# Patient Record
Sex: Female | Born: 1960 | Race: White | Hispanic: No
Health system: Southern US, Community
[De-identification: ages and names within clinical notes are randomized; demographics above are authoritative.]

## PROBLEM LIST (undated history)

## (undated) DIAGNOSIS — K5792 Diverticulitis of intestine, part unspecified, without perforation or abscess without bleeding: Secondary | ICD-10-CM

## (undated) HISTORY — DX: Diverticulitis of intestine, part unspecified, without perforation or abscess without bleeding: K57.92

---

## 2001-09-14 ENCOUNTER — Encounter: Admission: RE | Admit: 2001-09-14 | Discharge: 2001-09-14 | Payer: Self-pay | Admitting: Emergency Medicine

## 2001-09-14 ENCOUNTER — Encounter: Payer: Self-pay | Admitting: Emergency Medicine

## 2001-09-20 ENCOUNTER — Encounter: Payer: Self-pay | Admitting: Emergency Medicine

## 2001-09-20 ENCOUNTER — Encounter: Admission: RE | Admit: 2001-09-20 | Discharge: 2001-09-20 | Payer: Self-pay | Admitting: Emergency Medicine

## 2001-10-03 ENCOUNTER — Other Ambulatory Visit: Admission: RE | Admit: 2001-10-03 | Discharge: 2001-10-03 | Payer: Self-pay | Admitting: Obstetrics and Gynecology

## 2001-10-10 ENCOUNTER — Encounter: Payer: Self-pay | Admitting: Obstetrics and Gynecology

## 2001-10-10 ENCOUNTER — Ambulatory Visit (HOSPITAL_COMMUNITY): Admission: RE | Admit: 2001-10-10 | Discharge: 2001-10-10 | Payer: Self-pay | Admitting: Obstetrics and Gynecology

## 2001-10-16 ENCOUNTER — Encounter: Admission: RE | Admit: 2001-10-16 | Discharge: 2001-10-16 | Payer: Self-pay | Admitting: Emergency Medicine

## 2001-10-16 ENCOUNTER — Encounter: Payer: Self-pay | Admitting: Emergency Medicine

## 2001-11-17 ENCOUNTER — Encounter: Admission: RE | Admit: 2001-11-17 | Discharge: 2001-11-17 | Payer: Self-pay | Admitting: Emergency Medicine

## 2001-11-17 ENCOUNTER — Encounter: Payer: Self-pay | Admitting: Emergency Medicine

## 2001-12-28 ENCOUNTER — Ambulatory Visit (HOSPITAL_COMMUNITY): Admission: RE | Admit: 2001-12-28 | Discharge: 2001-12-28 | Payer: Self-pay | Admitting: Emergency Medicine

## 2002-01-09 ENCOUNTER — Ambulatory Visit (HOSPITAL_COMMUNITY): Admission: RE | Admit: 2002-01-09 | Discharge: 2002-01-09 | Payer: Self-pay | Admitting: Obstetrics and Gynecology

## 2002-01-25 ENCOUNTER — Encounter: Admission: RE | Admit: 2002-01-25 | Discharge: 2002-01-25 | Payer: Self-pay | Admitting: Emergency Medicine

## 2002-01-25 ENCOUNTER — Encounter: Payer: Self-pay | Admitting: Emergency Medicine

## 2002-01-26 ENCOUNTER — Ambulatory Visit (HOSPITAL_COMMUNITY): Admission: RE | Admit: 2002-01-26 | Discharge: 2002-01-26 | Payer: Self-pay | Admitting: Obstetrics and Gynecology

## 2002-08-02 ENCOUNTER — Ambulatory Visit (HOSPITAL_COMMUNITY): Admission: RE | Admit: 2002-08-02 | Discharge: 2002-08-02 | Payer: Self-pay | Admitting: Emergency Medicine

## 2002-08-02 ENCOUNTER — Encounter: Admission: RE | Admit: 2002-08-02 | Discharge: 2002-08-02 | Payer: Self-pay | Admitting: Emergency Medicine

## 2002-08-02 ENCOUNTER — Encounter: Payer: Self-pay | Admitting: Emergency Medicine

## 2002-08-06 ENCOUNTER — Ambulatory Visit (HOSPITAL_COMMUNITY): Admission: RE | Admit: 2002-08-06 | Discharge: 2002-08-06 | Payer: Self-pay | Admitting: Emergency Medicine

## 2002-08-06 ENCOUNTER — Encounter: Payer: Self-pay | Admitting: Emergency Medicine

## 2002-09-17 ENCOUNTER — Ambulatory Visit (HOSPITAL_COMMUNITY): Admission: RE | Admit: 2002-09-17 | Discharge: 2002-09-17 | Payer: Self-pay | Admitting: Gastroenterology

## 2003-03-27 ENCOUNTER — Encounter: Admission: RE | Admit: 2003-03-27 | Discharge: 2003-03-27 | Payer: Self-pay | Admitting: Emergency Medicine

## 2003-04-25 ENCOUNTER — Encounter: Admission: RE | Admit: 2003-04-25 | Discharge: 2003-04-25 | Payer: Self-pay | Admitting: Emergency Medicine

## 2004-05-08 IMAGING — CT CT ABDOMEN W/O CM
1 series · 15 of 32 positions shown, 19 images · IV contrast (BAROCAT)
Comparison: none

FINDINGS
CLINICAL DATA: LEFT LOWER QUADRANT ABDOMINAL PELVIC PAIN.  PREVIOUS RIGHT OVARIAN CYST.  POST
LAPAROSCOPIC SURGERY.  FEVER.  WEIGHT LOSS.  MICROHEMATURIA.  DIARRHEA.  THYROID WORKUP MITIGATES
AGAINST IODINE CONTRAST ADMINISTRATION.
ABDOMINAL CT WITHOUT CONTRAST:

[Series 2: routine abdomen · axial · 0.68mm/px · z∈[-355,-15]mm · 15 of 76 slices shown, 19 images]
[im 5/76  soft-tissue]
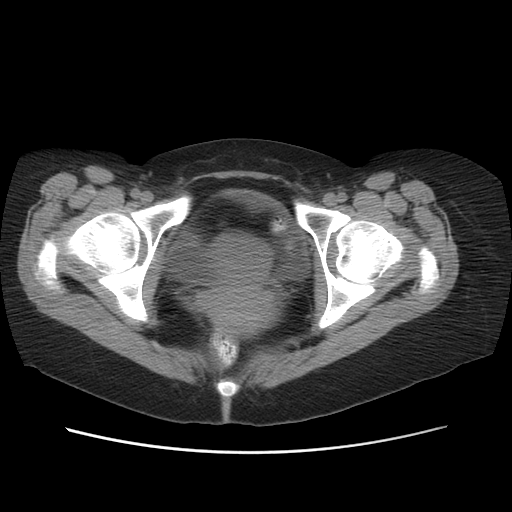
[im 5/76  bone]
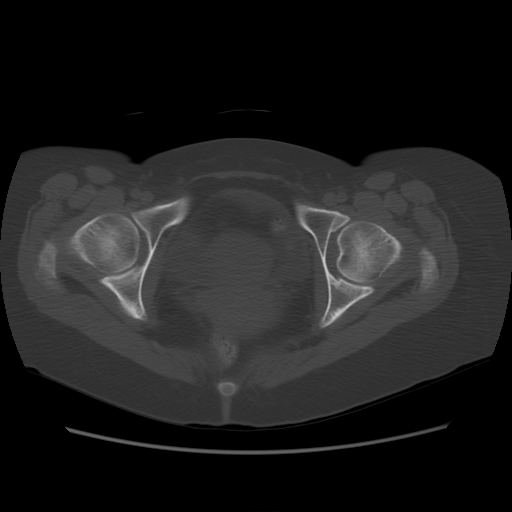
[im 10/76  soft-tissue]
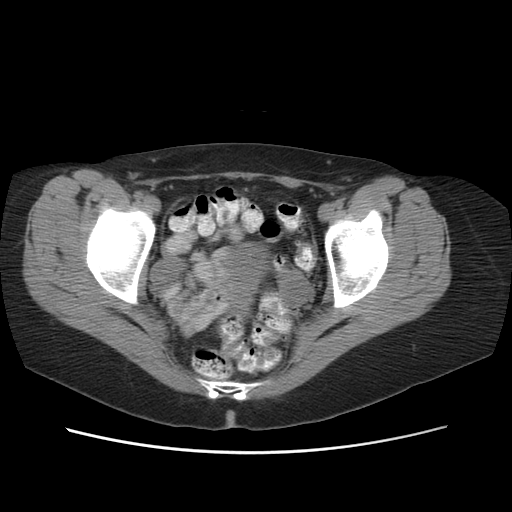
[im 15/76  soft-tissue]
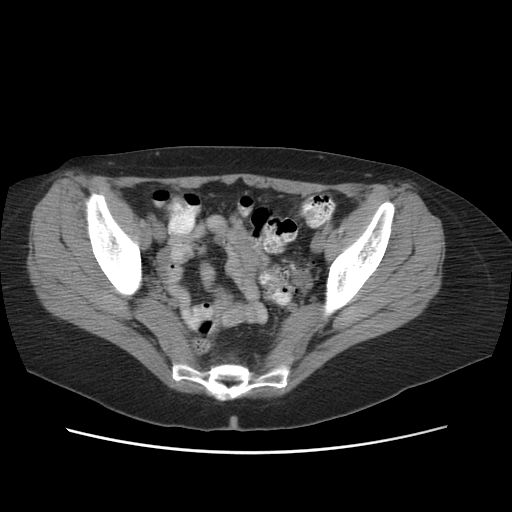
[im 22/76  soft-tissue]
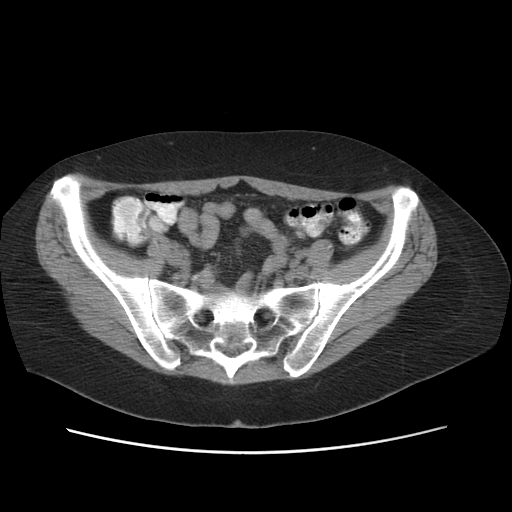
[im 27/76  soft-tissue]
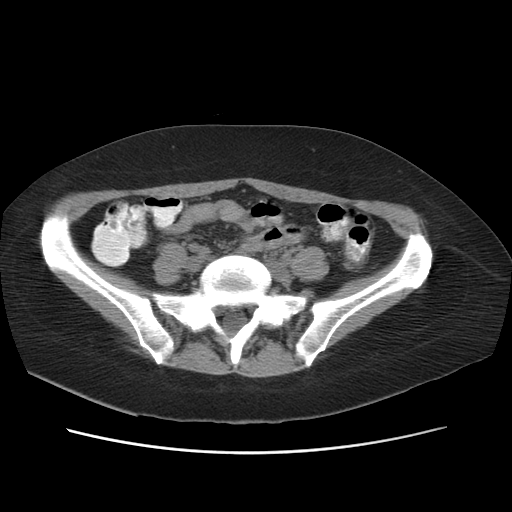
[im 32/76  soft-tissue]
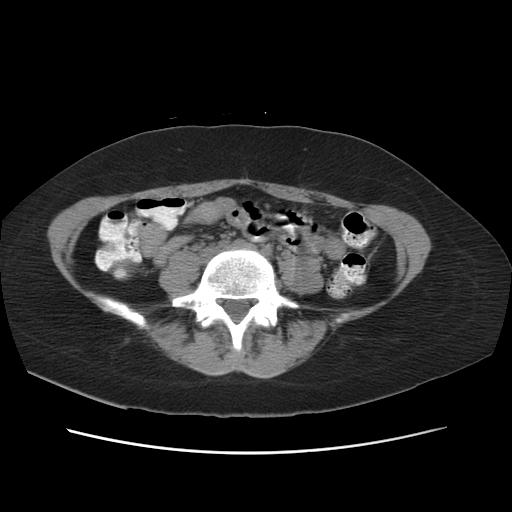
[im 39/76  soft-tissue]
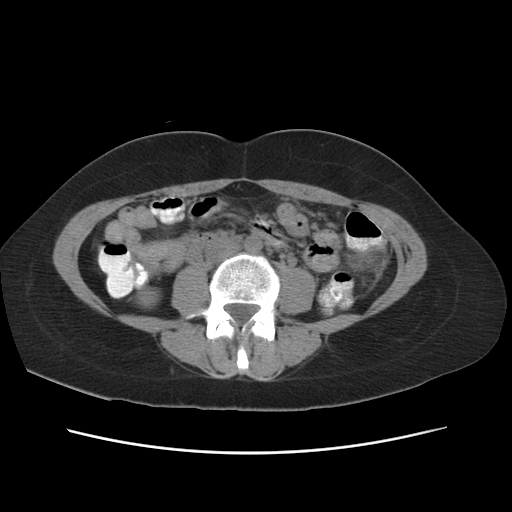
[im 44/76  soft-tissue]
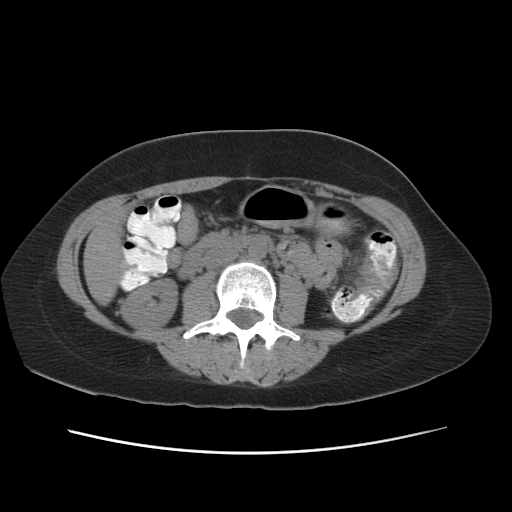
[im 49/76  soft-tissue]
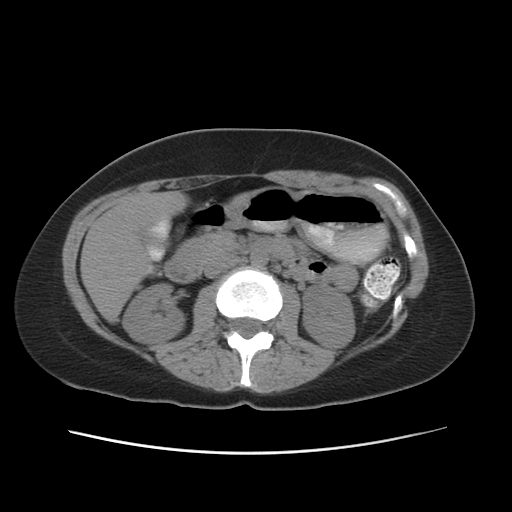
[im 49/76  bone]
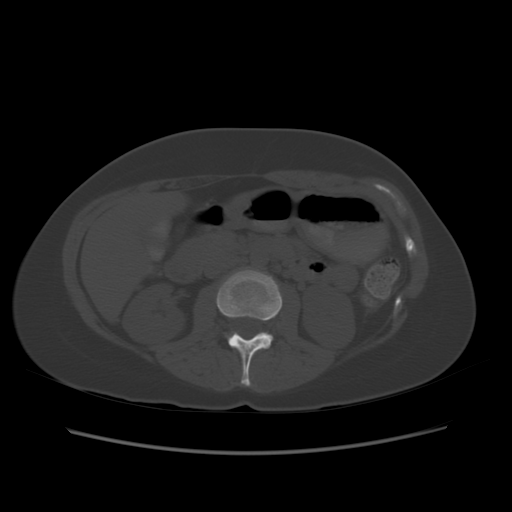
[im 54/76  soft-tissue]
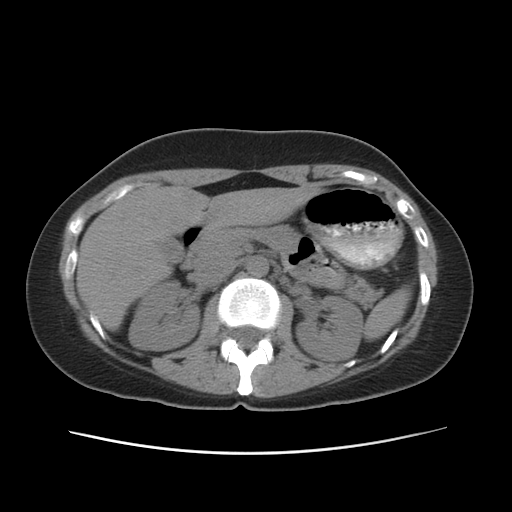
[im 61/76  soft-tissue]
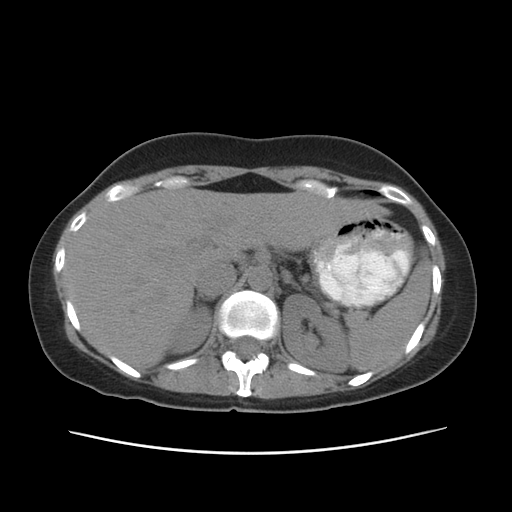
[im 66/76  soft-tissue]
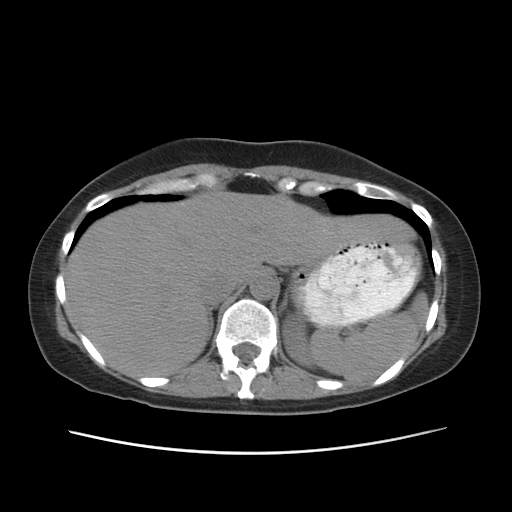
[im 66/76  lung]
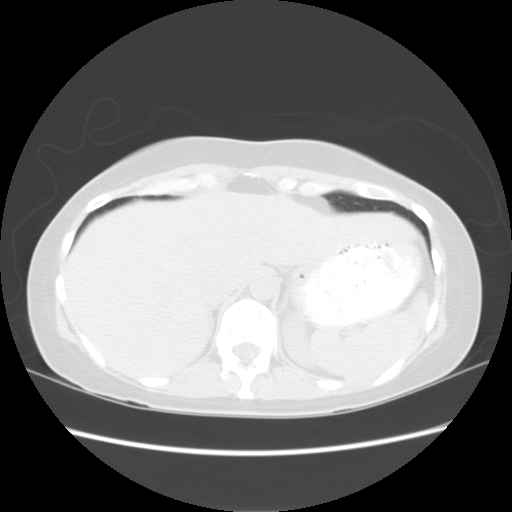
[im 68/76  lung]
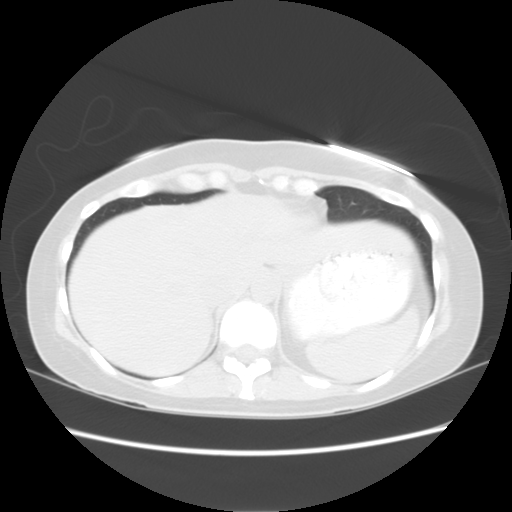
[im 71/76  soft-tissue]
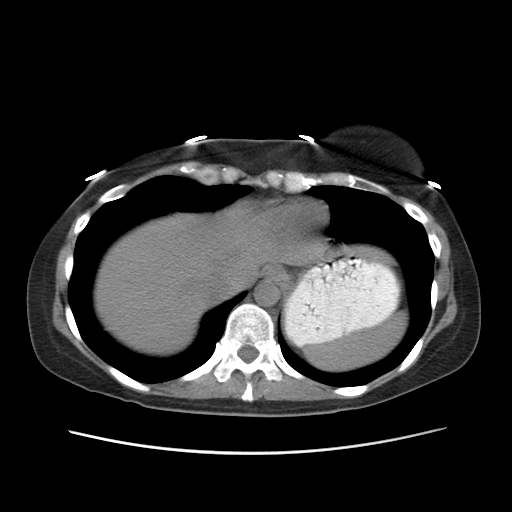
[im 71/76  lung]
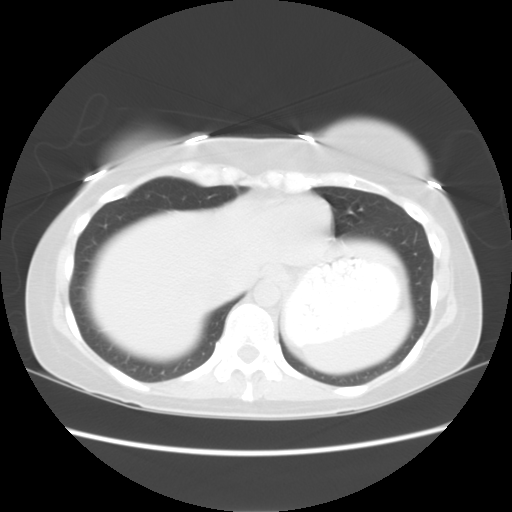
[im 73/76  lung]
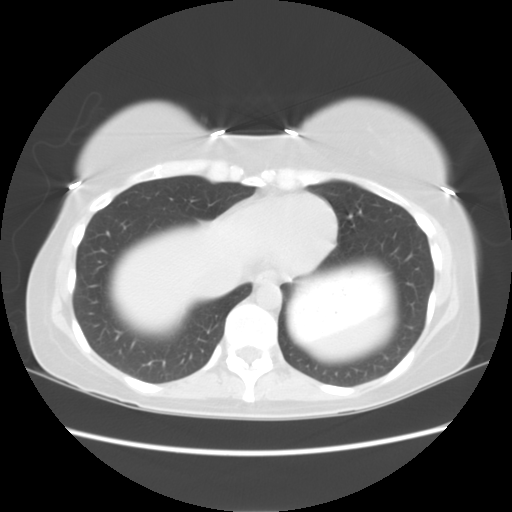

[15 of 32 positions shown; findings below may reference images not displayed]

FINDINGS: COMPARISON IS MADE WITH PRIOR ENHANCED STUDY OF 09/20/01.  FOLLOWING BARCAT ORAL CONTRAST ONLY
MULTIDETECTOR SPIRAL AXIAL IMAGES THROUGH THE ABDOMEN DEMONSTRATES INTERVAL APPROXIMATE 4 CM AREA
OF ABNORMAL MURAL THICKENING AT THE MID DESCENDING COLON WITH ADJACENT MESENTERIC AND MINIMAL
PERICOLIC GUTTER EDEMA.  THIS FAVORS FOCAL COLITIS ALTHOUGH NO DIVERTICULOSIS IS SEEN BY CT
ASSESSMENT.  PRIMARY COLON CARCINOMA IS POSSIBLE YET FELT LESS LIKELY.  SINCE PRIOR STUDY TWO 4 MM
HEPATIC CYSTS ARE STABLE (IMAGES 12 AND 16).  NO INTERVAL RENAL CALCULI OR HYDRONEPHROSIS ARE SEEN.
 REMAINING ABDOMINAL ORGANS ARE STABLE WITH NO OTHER NEW INFLAMMATION, FREE AIR, ABSCESS, NOR
ADENOPATHY SEEN.
IMPRESSION: SINCE 09/20/01:
[DATE].  PROBABLE FOCAL DESCENDING COLITIS WITH PRIMARY COLON CARCINOMA FELT LESS LIKELY.
2.  TWO TINY RIGHT HEPATIC CYSTS STABLE.
3.  OTHERWISE NEGATIVE.
PELVIC CT WITHOUT CONTRAST:
NON-CONTRAST SPIRAL CT OF THE PELVIS WAS PERFORMED.
THERE IS NO EVIDENCE OF URINARY TRACT CALCULI OR URETERECTASIS.  THE OTHER PELVIC STRUCTURES ARE
UNREMARKABLE. THERE IS NO EVIDENCE OF MASSES, INFLAMMATORY PROCESS, OR ABNORMAL FLUID COLLECTIONS.
SINCE 09/20/01, PREVIOUS RIGHT OVARIAN CYST IS NO LONGER PRESENT.
IMPRESSION
NORMAL.

## 2004-12-31 IMAGING — CT CT PELVIS W/ CM
1 series · 15 of 32 positions shown, 19 images · IV contrast (G+ & 100 ML OMNI 300)
Comparison: none

CLINICAL DATA: Lower abdominal pain.  Con ? V14.8,
ABDOMEN AND PELVIS CT WITH CONTRAST
Comparison 08/02/02.
TECHNIQUE: Contiguous axial images of the abdomen and pelvis were obtained after oral and 100 cc of nonionic intravenous contrast.

[Series 2: routine abdomen · axial · 0.66mm/px · z∈[-375,-50]mm · 15 of 102 slices shown, 19 images]
[im 7/102  soft-tissue]
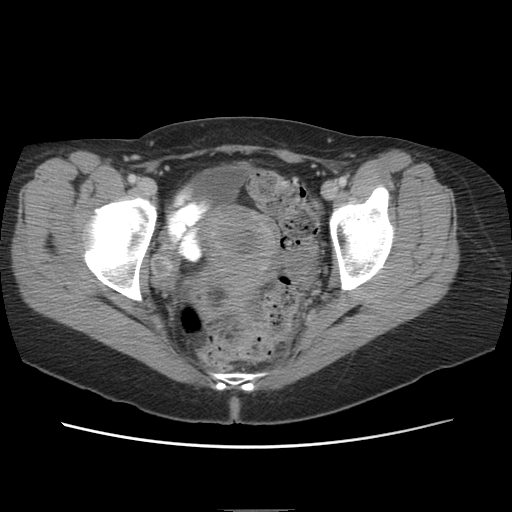
[im 7/102  bone]
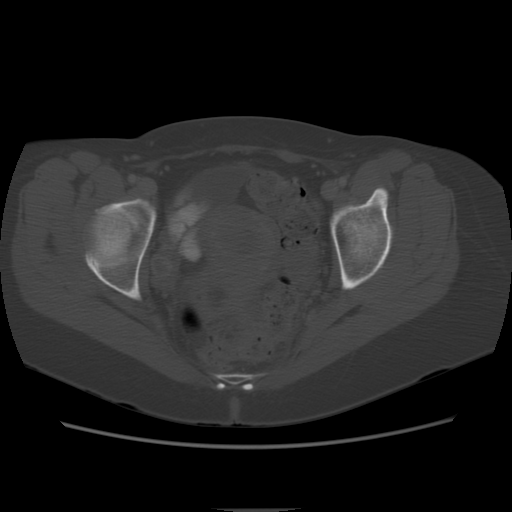
[im 14/102  soft-tissue]
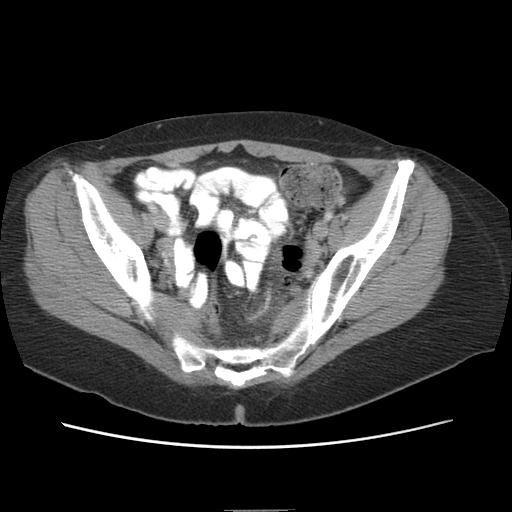
[im 20/102  soft-tissue]
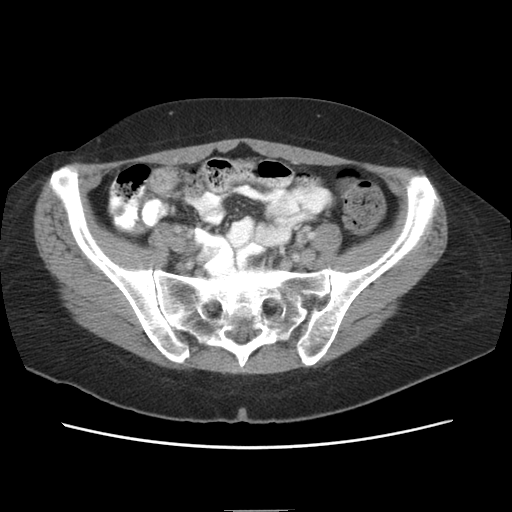
[im 30/102  soft-tissue]
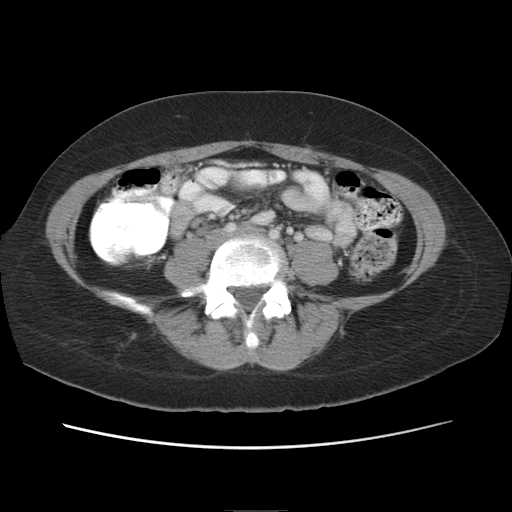
[im 36/102  soft-tissue]
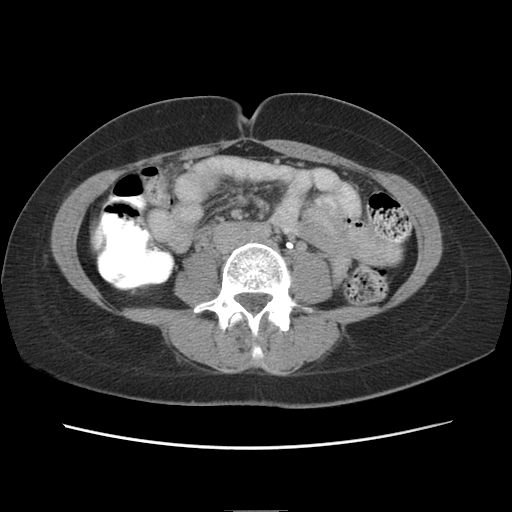
[im 43/102  soft-tissue]
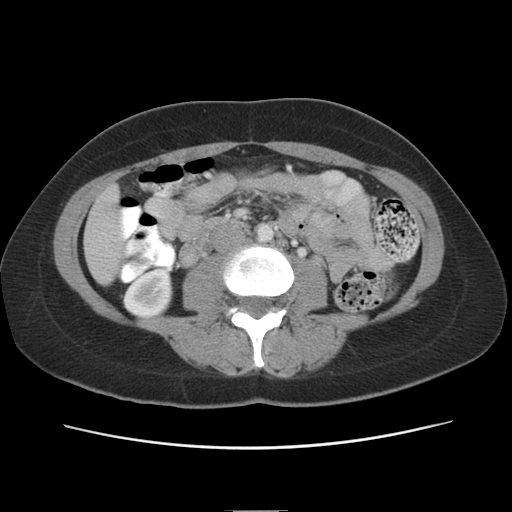
[im 53/102  soft-tissue]
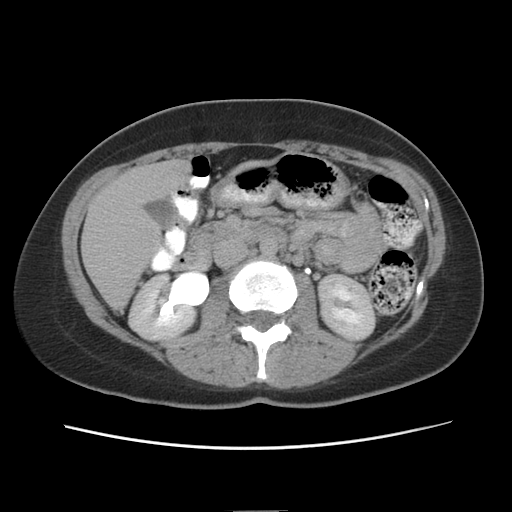
[im 59/102  soft-tissue]
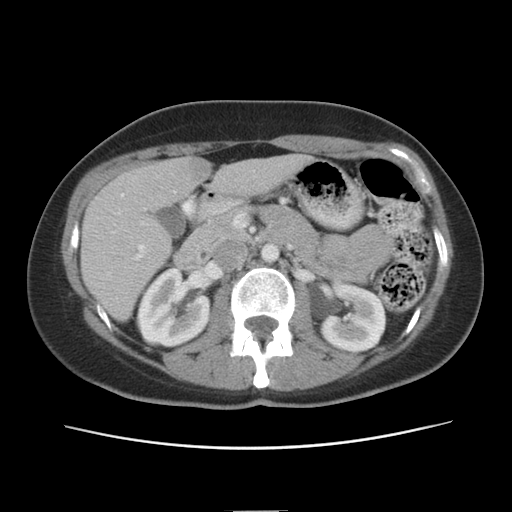
[im 66/102  soft-tissue]
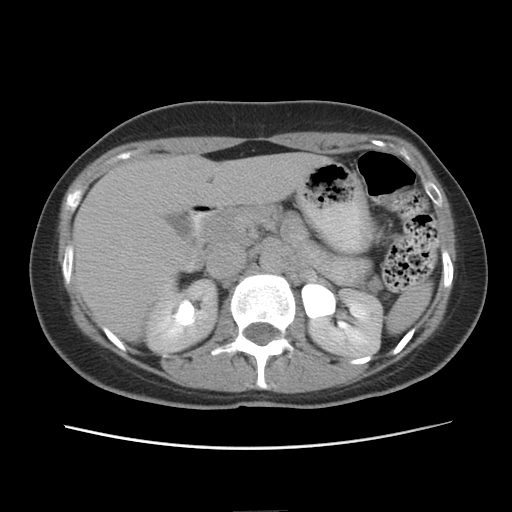
[im 66/102  bone]
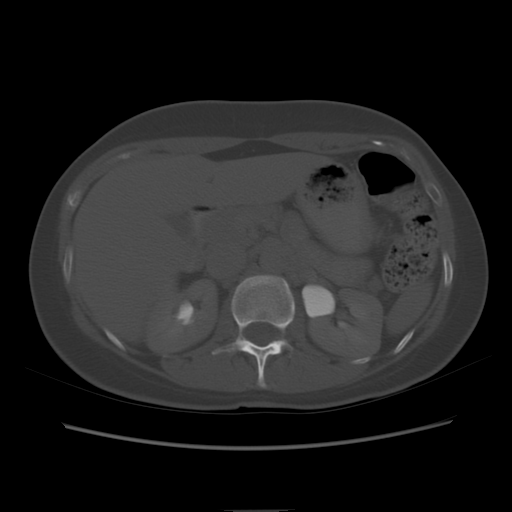
[im 72/102  soft-tissue]
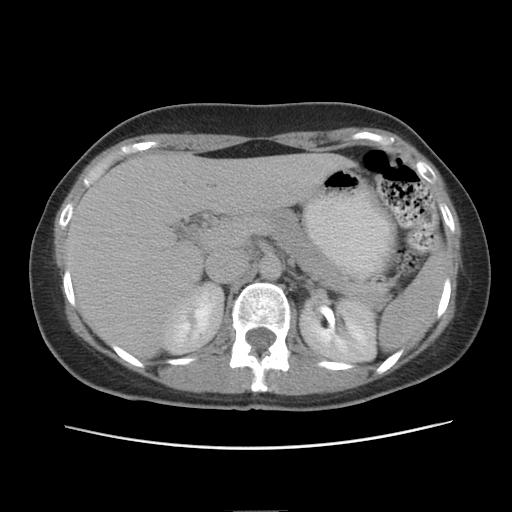
[im 82/102  soft-tissue]
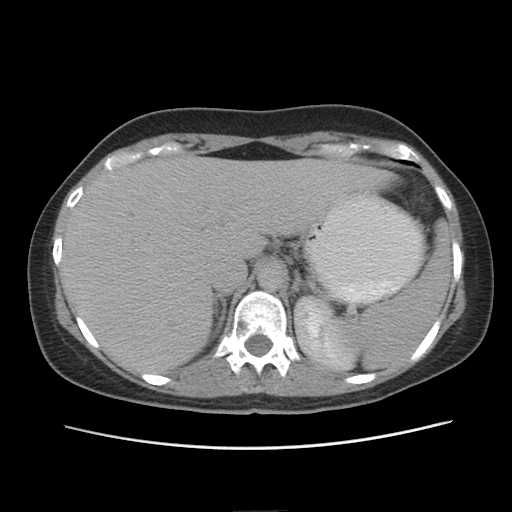
[im 88/102  soft-tissue]
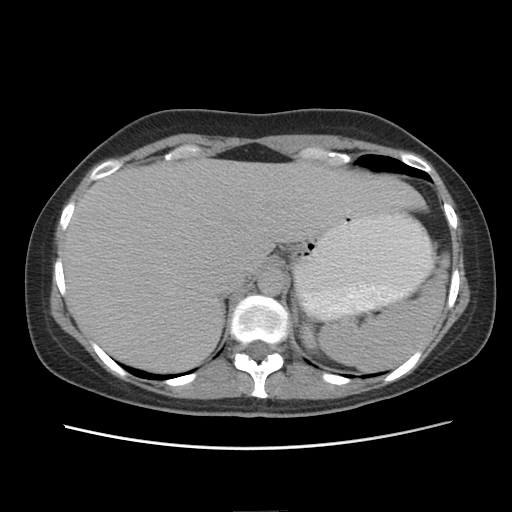
[im 88/102  lung]
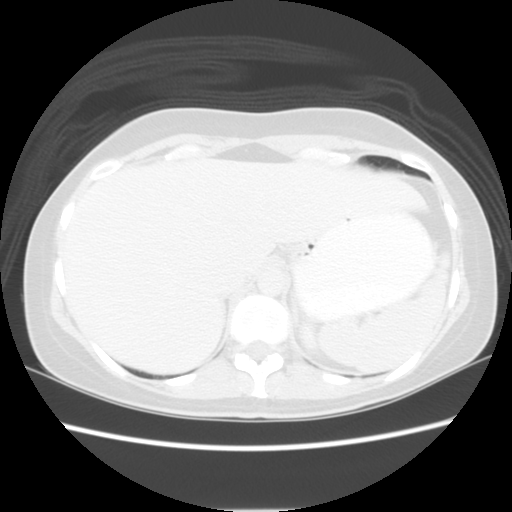
[im 92/102  lung]
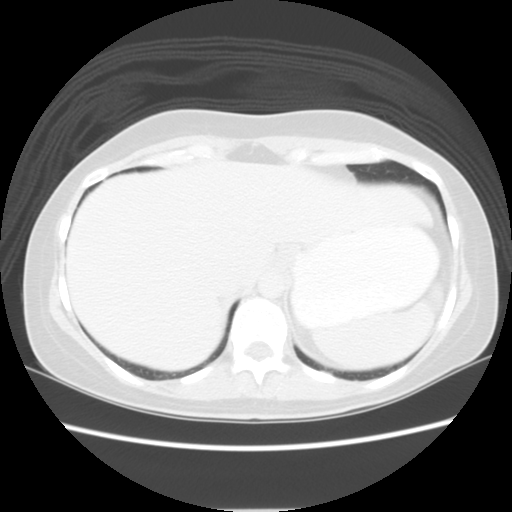
[im 95/102  soft-tissue]
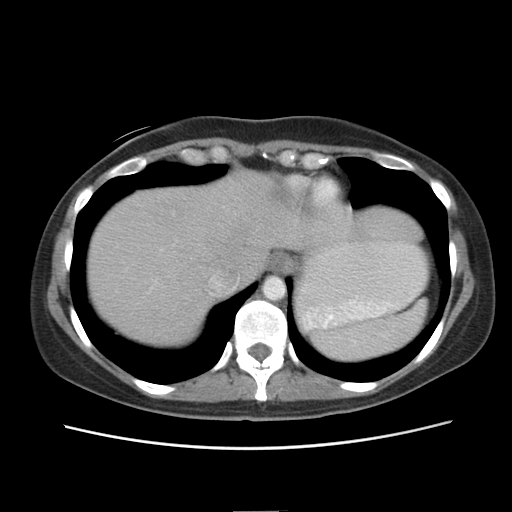
[im 95/102  lung]
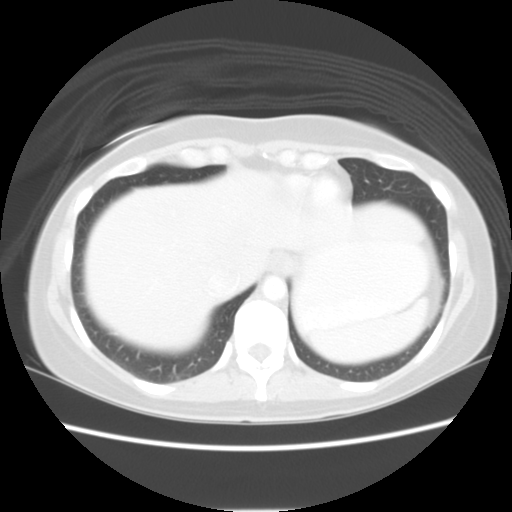
[im 98/102  lung]
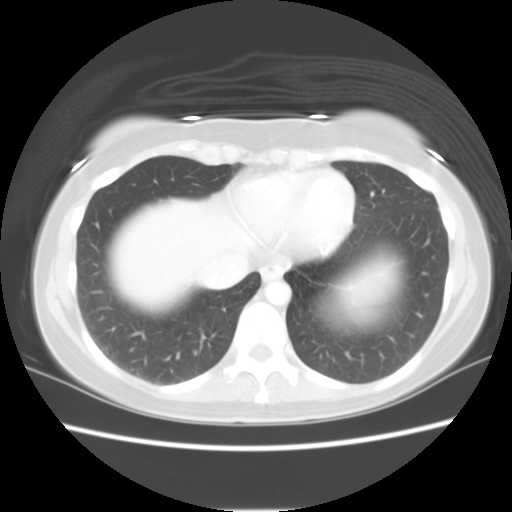

[15 of 32 positions shown; findings below may reference images not displayed]

FINDINGS: ABDOMEN CT:
Several scattered small low density right liver lesions are stable.  Area of decreased attenuation at the falciform ligament is in a characteristic position for perfusion anomaly or focal fatty infiltration.  The spleen, stomach, duodenum, pancreas, adrenal glands, gallbladder, and kidneys are unremarkable.  There is no free fluid or adenopathy.  

The area of pericolonic inflammation seen in the proximal transverse colon on a previous study has resolved. The colon is diffusely stool-filled.  
IMPRESSION
1.  Interval resolution of the edema around the proximal descending colon seen previously.
2.  Prominent stool throughout the course of the colon raises the question of constipation.
3.  Multiple tiny low density liver lesions, stable.

PELVIS CT:
Imaging through the anatomic pelvis shows a redundant sigmoid colon with diverticular change.  There is inflammation around the sigmoid segment of the colon suggesting diverticulitis.  No free air or abscess is apparent.  

A small amount of fluid is noted in the endometrial cavity which can be physiologic in a premenopausal female.  Two ring enhancing structures in the right ovary are both less than 2 cm in size, compatible with prominent follicles. 
IMPRESSION
1.  Changes in the sigmoid colon suggest diverticulitis.
2.  There is no evidence for a perforation or abscess. 

*This report was called to Dr. Law on the afternoon of 03/27/03.

## 2005-01-29 IMAGING — CT CT ABDOMEN W/ CM
1 series · 15 of 32 positions shown, 19 images · IV contrast (GASTRO & [ID]/300)
Comparison: none

CLINICAL DATA: Prior history of diverticulitis.  Now with right lower quadrant pain.  Con ? none.
 CT OF THE ABDOMEN AND PELVIS WITH IV CONTRAST
 CT ABDOMEN WITH IV CONTRAST:
 Multidetector helical study performed during IV contrast enhancement with 100 cc of FmnipaqueS88 injected at 2 cc per second. Comparison is made with the 03/27/03 study.  
 There are multiple tiny cysts seen associated with the liver which are stable.  The liver otherwise has a normal appearance.  The pancreas, spleen, kidneys, and adrenal glands have a normal appearance.  There is no retroperitoneal adenopathy. 
 IMPRESSION
 Stable small hepatic cyst. The remainder of the study is normal.
 CT OF THE PELVIS WITH IV CONTRAST:
 The previously seen changes of diverticulitis have resolved.  There is no pelvic mass or adenopathy.  There is a 1.5 cm in size left ovarian cyst noted on today?s study.  The previously seen vallecular cyst associated with the right ovary has resolved.  There is no free pelvic fluid.
 Interval resolution of the previously seen changes of diverticulitis.  1.5 cm in size left ovarian cyst most likely representing a follicular cyst.  Resolution of the right ovarian cyst intervally.

[Series 2: routine abdomen · axial · 0.62mm/px · z∈[-388,-18]mm · 15 of 118 slices shown, 19 images]
[im 8/118  soft-tissue]
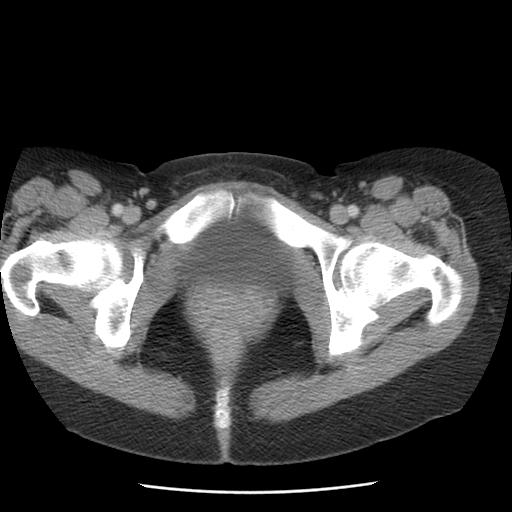
[im 8/118  bone]
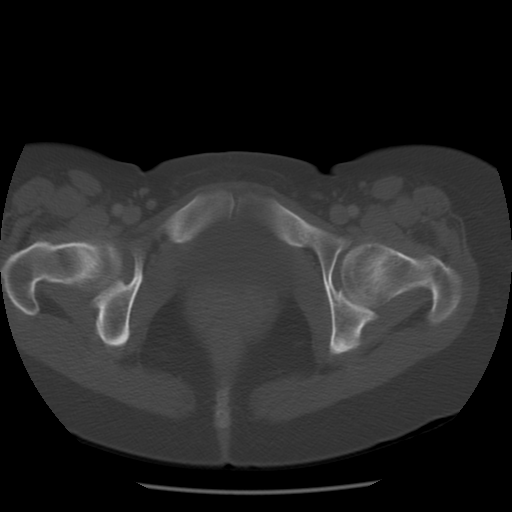
[im 16/118  soft-tissue]
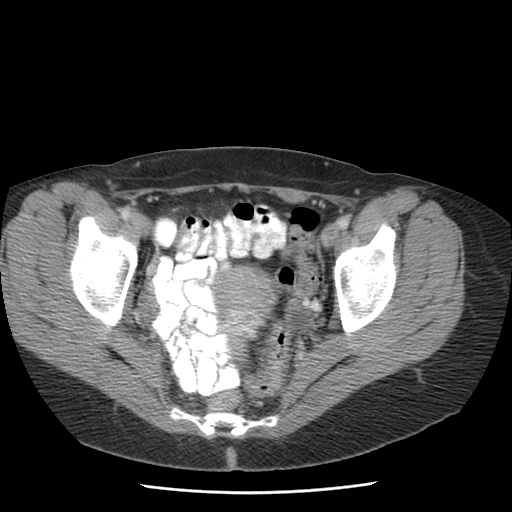
[im 23/118  soft-tissue]
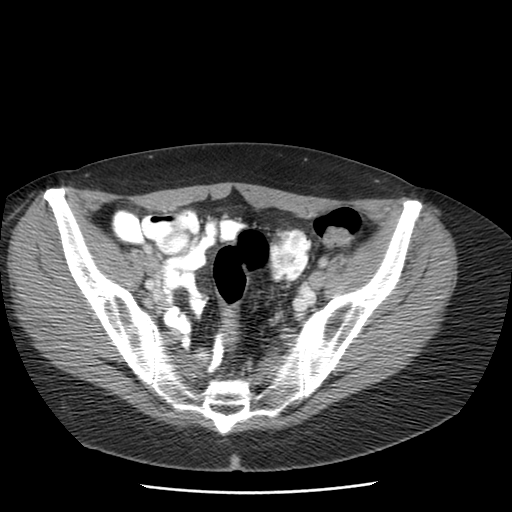
[im 34/118  soft-tissue]
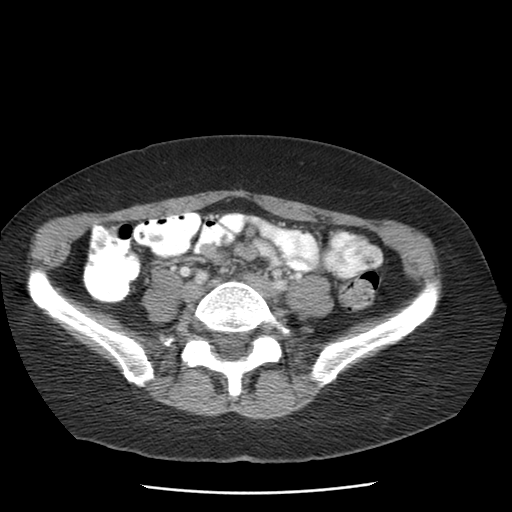
[im 42/118  soft-tissue]
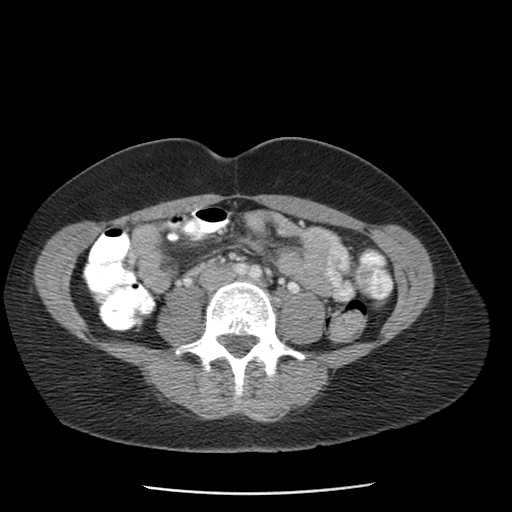
[im 50/118  soft-tissue]
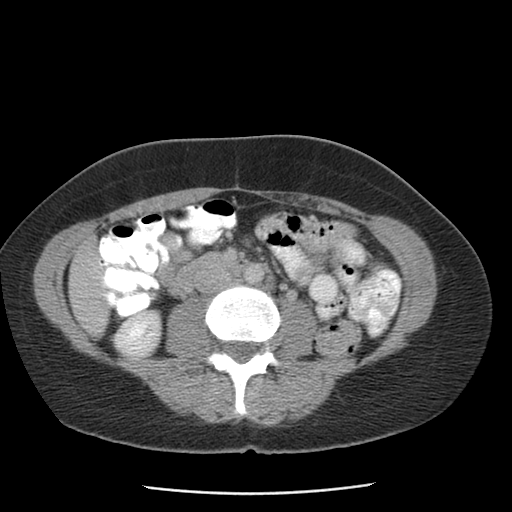
[im 61/118  soft-tissue]
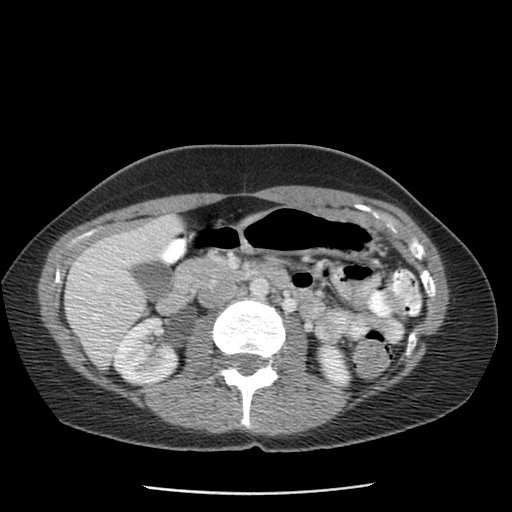
[im 68/118  soft-tissue]
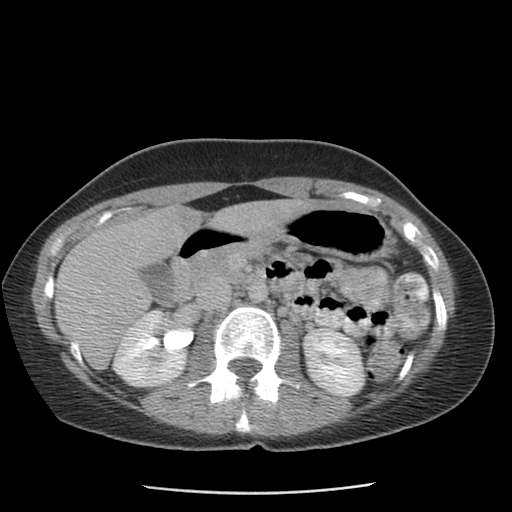
[im 76/118  soft-tissue]
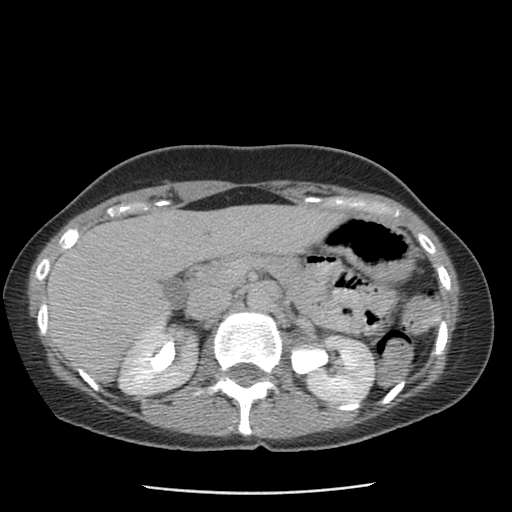
[im 76/118  bone]
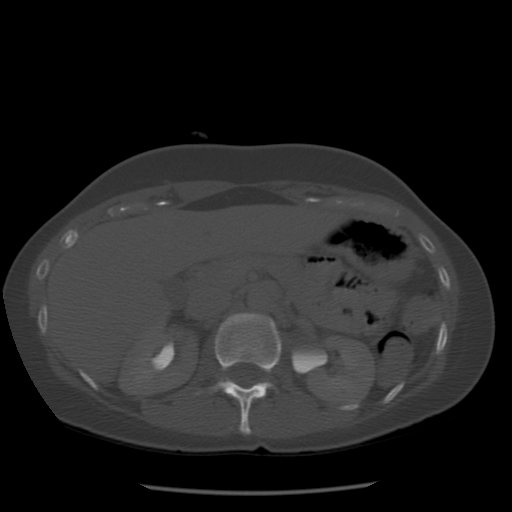
[im 84/118  soft-tissue]
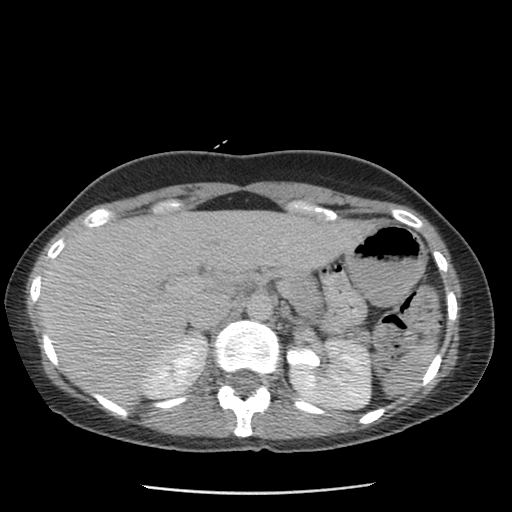
[im 95/118  soft-tissue]
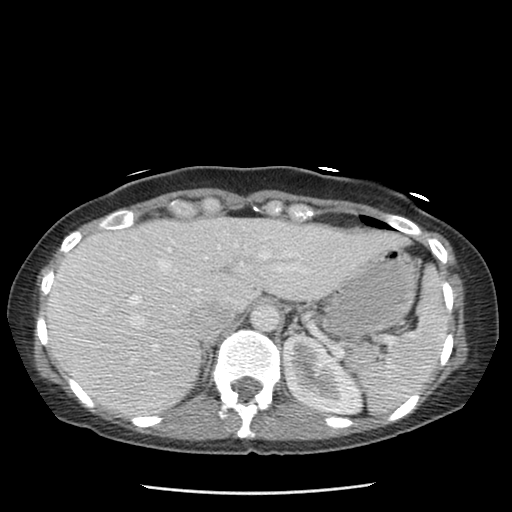
[im 102/118  soft-tissue]
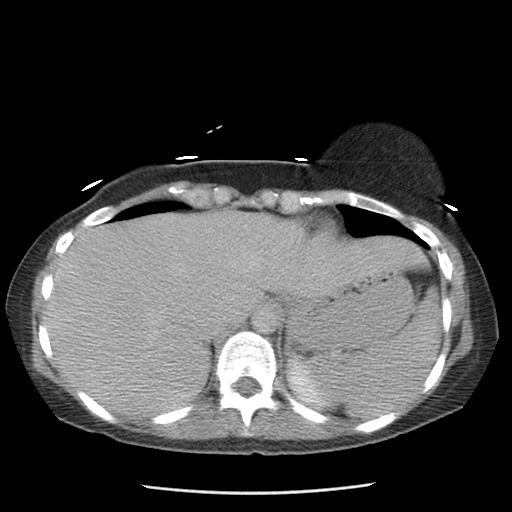
[im 102/118  lung]
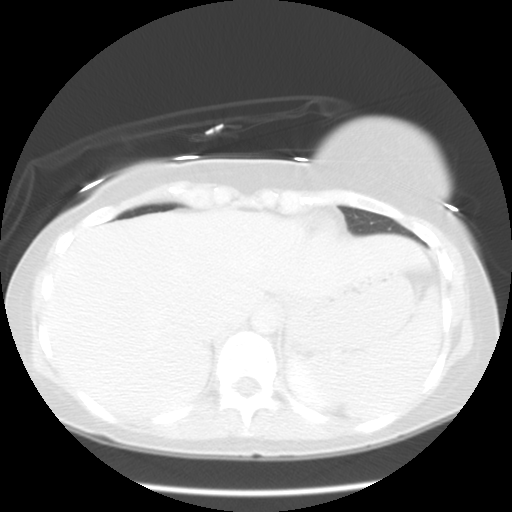
[im 106/118  lung]
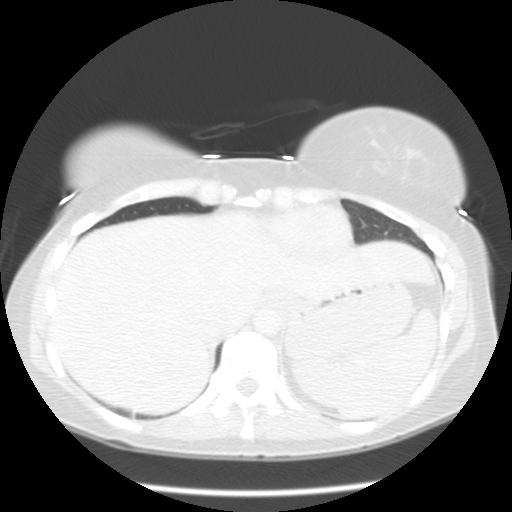
[im 110/118  soft-tissue]
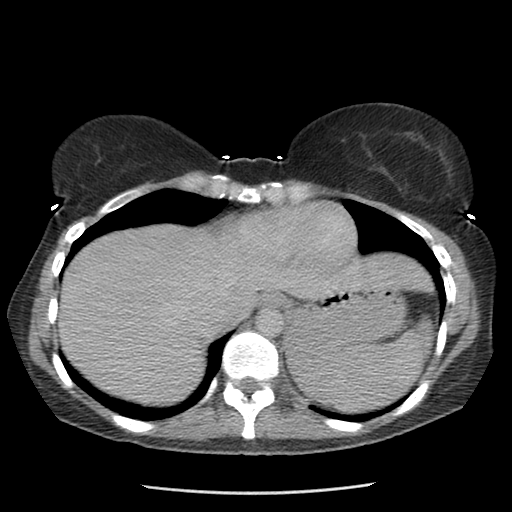
[im 110/118  lung]
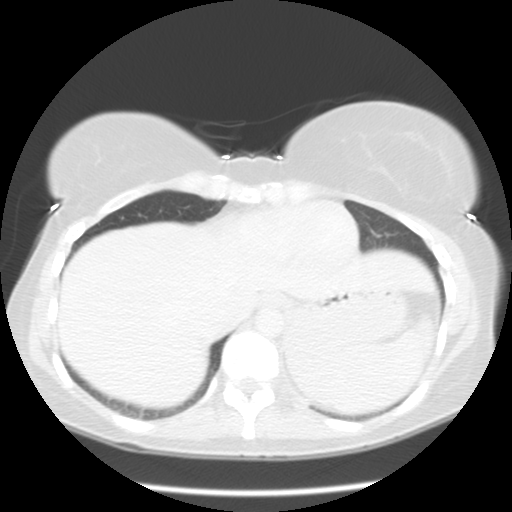
[im 114/118  lung]
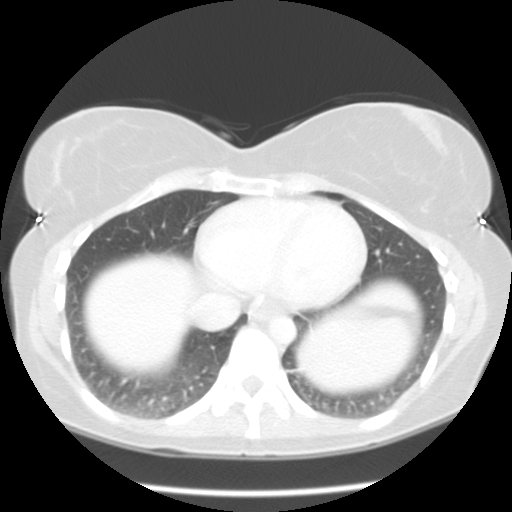

[15 of 32 positions shown; findings below may reference images not displayed]

## 2014-04-15 ENCOUNTER — Emergency Department: Payer: Self-pay | Admitting: Emergency Medicine

## 2014-04-15 LAB — BASIC METABOLIC PANEL
Anion Gap: 6 — ABNORMAL LOW (ref 7–16)
BUN: 11 mg/dL (ref 7–18)
CHLORIDE: 109 mmol/L — AB (ref 98–107)
CREATININE: 0.97 mg/dL (ref 0.60–1.30)
Calcium, Total: 9 mg/dL (ref 8.5–10.1)
Co2: 23 mmol/L (ref 21–32)
EGFR (African American): >60
EGFR (Non-African Amer.): >60
Glucose: 84 mg/dL (ref 65–99)
Osmolality: 274 (ref 275–301)
Potassium: 4.4 mmol/L (ref 3.5–5.1)
SODIUM: 138 mmol/L (ref 136–145)

## 2014-04-15 LAB — CBC WITH DIFFERENTIAL/PLATELET
BASOS PCT: 0.5 %
Basophil #: 0 10*3/uL (ref 0.0–0.1)
EOS PCT: 2.2 %
Eosinophil #: 0.1 10*3/uL (ref 0.0–0.7)
HCT: 35.8 % (ref 35.0–47.0)
HGB: 11.6 g/dL — AB (ref 12.0–16.0)
Lymphocyte #: 1.2 10*3/uL (ref 1.0–3.6)
Lymphocyte %: 20.3 %
MCH: 29.3 pg (ref 26.0–34.0)
MCHC: 32.5 g/dL (ref 32.0–36.0)
MCV: 90 fL (ref 80–100)
Monocyte #: 0.4 x10 3/mm (ref 0.2–0.9)
Monocyte %: 6.8 %
NEUTROS ABS: 4.2 10*3/uL (ref 1.4–6.5)
Neutrophil %: 70.2 %
Platelet: 250 10*3/uL (ref 150–440)
RBC: 3.97 10*6/uL (ref 3.80–5.20)
RDW: 13.6 % (ref 11.5–14.5)
WBC: 6.1 10*3/uL (ref 3.6–11.0)

## 2014-04-15 LAB — TROPONIN I

## 2018-08-26 DIAGNOSIS — R509 Fever, unspecified: Secondary | ICD-10-CM | POA: Diagnosis not present

## 2018-08-26 DIAGNOSIS — F3189 Other bipolar disorder: Secondary | ICD-10-CM

## 2018-08-26 DIAGNOSIS — E871 Hypo-osmolality and hyponatremia: Secondary | ICD-10-CM | POA: Diagnosis not present

## 2018-08-26 DIAGNOSIS — K567 Ileus, unspecified: Secondary | ICD-10-CM

## 2018-08-26 DIAGNOSIS — K5720 Diverticulitis of large intestine with perforation and abscess without bleeding: Secondary | ICD-10-CM | POA: Diagnosis not present

## 2018-08-26 DIAGNOSIS — K5900 Constipation, unspecified: Secondary | ICD-10-CM

## 2018-08-27 DIAGNOSIS — K5720 Diverticulitis of large intestine with perforation and abscess without bleeding: Secondary | ICD-10-CM | POA: Diagnosis not present

## 2018-08-27 DIAGNOSIS — E871 Hypo-osmolality and hyponatremia: Secondary | ICD-10-CM | POA: Diagnosis not present

## 2018-08-27 DIAGNOSIS — F3189 Other bipolar disorder: Secondary | ICD-10-CM | POA: Diagnosis not present

## 2018-08-27 DIAGNOSIS — R509 Fever, unspecified: Secondary | ICD-10-CM | POA: Diagnosis not present

## 2018-08-28 DIAGNOSIS — F3189 Other bipolar disorder: Secondary | ICD-10-CM | POA: Diagnosis not present

## 2018-08-28 DIAGNOSIS — K5720 Diverticulitis of large intestine with perforation and abscess without bleeding: Secondary | ICD-10-CM | POA: Diagnosis not present

## 2018-08-28 DIAGNOSIS — R509 Fever, unspecified: Secondary | ICD-10-CM | POA: Diagnosis not present

## 2018-08-28 DIAGNOSIS — E871 Hypo-osmolality and hyponatremia: Secondary | ICD-10-CM | POA: Diagnosis not present

## 2018-08-29 DIAGNOSIS — R509 Fever, unspecified: Secondary | ICD-10-CM | POA: Diagnosis not present

## 2018-08-29 DIAGNOSIS — K5720 Diverticulitis of large intestine with perforation and abscess without bleeding: Secondary | ICD-10-CM | POA: Diagnosis not present

## 2018-08-29 DIAGNOSIS — Z01818 Encounter for other preprocedural examination: Secondary | ICD-10-CM | POA: Diagnosis not present

## 2018-08-29 DIAGNOSIS — F3189 Other bipolar disorder: Secondary | ICD-10-CM | POA: Diagnosis not present

## 2018-08-29 DIAGNOSIS — E871 Hypo-osmolality and hyponatremia: Secondary | ICD-10-CM | POA: Diagnosis not present

## 2018-08-30 DIAGNOSIS — F3189 Other bipolar disorder: Secondary | ICD-10-CM | POA: Diagnosis not present

## 2018-08-30 DIAGNOSIS — R509 Fever, unspecified: Secondary | ICD-10-CM | POA: Diagnosis not present

## 2018-08-30 DIAGNOSIS — E871 Hypo-osmolality and hyponatremia: Secondary | ICD-10-CM | POA: Diagnosis not present

## 2018-08-30 DIAGNOSIS — K5720 Diverticulitis of large intestine with perforation and abscess without bleeding: Secondary | ICD-10-CM | POA: Diagnosis not present

## 2018-08-31 DIAGNOSIS — F3189 Other bipolar disorder: Secondary | ICD-10-CM | POA: Diagnosis not present

## 2018-08-31 DIAGNOSIS — R509 Fever, unspecified: Secondary | ICD-10-CM | POA: Diagnosis not present

## 2018-08-31 DIAGNOSIS — E871 Hypo-osmolality and hyponatremia: Secondary | ICD-10-CM | POA: Diagnosis not present

## 2018-08-31 DIAGNOSIS — K5720 Diverticulitis of large intestine with perforation and abscess without bleeding: Secondary | ICD-10-CM | POA: Diagnosis not present

## 2022-02-08 ENCOUNTER — Emergency Department (HOSPITAL_COMMUNITY)
Admission: EM | Admit: 2022-02-08 | Discharge: 2022-02-08 | Disposition: A | Discharge status: Home / Self Care | Payer: Medicaid Other | Attending: Emergency Medicine | Admitting: Emergency Medicine

## 2022-02-08 ENCOUNTER — Other Ambulatory Visit: Payer: Self-pay

## 2022-02-08 ENCOUNTER — Encounter (HOSPITAL_COMMUNITY): Payer: Self-pay

## 2022-02-08 DIAGNOSIS — K5732 Diverticulitis of large intestine without perforation or abscess without bleeding: Secondary | ICD-10-CM | POA: Insufficient documentation

## 2022-02-08 DIAGNOSIS — K5792 Diverticulitis of intestine, part unspecified, without perforation or abscess without bleeding: Secondary | ICD-10-CM

## 2022-02-08 LAB — CBC WITH DIFFERENTIAL/PLATELET
Abs Immature Granulocytes: 0.02 10*3/uL (ref 0.00–0.07)
Basophils Absolute: 0 10*3/uL (ref 0.0–0.1)
Basophils Relative: 1 %
Eosinophils Absolute: 0.1 10*3/uL (ref 0.0–0.5)
Eosinophils Relative: 2 %
HCT: 35.9 % — ABNORMAL LOW (ref 36.0–46.0)
Hemoglobin: 12.1 g/dL (ref 12.0–15.0)
Immature Granulocytes: 0 %
Lymphocytes Relative: 30 %
Lymphs Abs: 1.7 10*3/uL (ref 0.7–4.0)
MCH: 30.6 pg (ref 26.0–34.0)
MCHC: 33.7 g/dL (ref 30.0–36.0)
MCV: 90.9 fL (ref 80.0–100.0)
Monocytes Absolute: 0.5 10*3/uL (ref 0.1–1.0)
Monocytes Relative: 9 %
Neutro Abs: 3.5 10*3/uL (ref 1.7–7.7)
Neutrophils Relative %: 58 %
Platelets: 230 10*3/uL (ref 150–400)
RBC: 3.95 MIL/uL (ref 3.87–5.11)
RDW: 13.4 % (ref 11.5–15.5)
WBC: 5.9 10*3/uL (ref 4.0–10.5)
nRBC: 0 % (ref 0.0–0.2)

## 2022-02-08 LAB — BASIC METABOLIC PANEL
Anion gap: 8 (ref 5–15)
BUN: 9 mg/dL (ref 8–23)
CO2: 23 mmol/L (ref 22–32)
Calcium: 8.5 mg/dL — ABNORMAL LOW (ref 8.9–10.3)
Chloride: 105 mmol/L (ref 98–111)
Creatinine, Ser: 0.78 mg/dL (ref 0.44–1.00)
GFR, Estimated: >60 mL/min (ref >60)
Glucose, Bld: 99 mg/dL (ref 70–99)
Potassium: 4 mmol/L (ref 3.5–5.1)
Sodium: 136 mmol/L (ref 135–145)

## 2022-02-08 LAB — TROPONIN I (HIGH SENSITIVITY): Troponin I (High Sensitivity): 7 ng/L (ref <18)

## 2022-02-08 MED ORDER — METRONIDAZOLE 500 MG PO TABS: 500.0000 mg | ORAL_TABLET | Freq: Two times a day (BID) | ORAL | 0 refills | Status: AC

## 2022-02-08 MED ORDER — AMOXICILLIN-POT CLAVULANATE 875-125 MG PO TABS: 1.0000 | ORAL_TABLET | Freq: Two times a day (BID) | ORAL | 0 refills | Status: DC

## 2022-02-08 MED ORDER — CIPROFLOXACIN HCL 500 MG PO TABS: 500.0000 mg | ORAL_TABLET | Freq: Two times a day (BID) | ORAL | 0 refills | Status: AC

## 2022-02-08 MED ORDER — AMOXICILLIN-POT CLAVULANATE 875-125 MG PO TABS
1.0000 | ORAL_TABLET | Freq: Once | ORAL | Status: AC
  Administered 2022-02-08: 1 via ORAL
  Filled 2022-02-08: qty 1

## 2022-02-08 NOTE — ED Triage Notes (Signed)
Patient arrived by [facility] from home. Patient with near syncope and LLQ pain radiating into pelvis. Patient denies cp and no SOB. Ems found patient with initial BP in the 80s and administered NS 600. BP 112 prior to arrival. On arrival complains of nausea and declined zofran on the truck. Arrived with PIV x 2. EMS concerned due to abnormal EKG

## 2022-02-08 NOTE — ED Provider Notes (Signed)
[place] EMERGENCY DEPARTMENT Provider Note   CSN: [id number] Arrival date & time: 02/08/22  1201     History  No chief complaint on file.   Rebecca Hill is a 61 y.o. female.  HPI     61 year old female comes in with chief complaint of abdominal pain. She has history of diverticulosis, fibromyalgia.  Patient states that she started having lower quadrant abdominal pain earlier today.  Pain is consistent with her previous episode of diverticulitis.  She was visiting her psychiatrist, but in the parking lot the pain became intense and she felt like she was going to pass out.  Her son called EMS.  [facility] requires EKG for every abdominal pain, paramedics noted that she had ST elevations and brought patient to the ER at [facility].  Patient denies any chest pain, shortness of breath.  She has no cardiac history.  She denies any heavy smoking, substance use.  Per paramedics, patient's initial blood pressure was in the [id number] systolic.  She has received finder cc bolus prior to arrival.  BP has improved.  Patient denies any diarrhea, bloody stools.   Home Medications Prior to Admission medications   Medication Sig Start Date End Date Taking? Authorizing Provider  ciprofloxacin (CIPRO) 500 MG tablet Take 1 tablet (500 mg total) by mouth every 12 (twelve) hours. 02/08/22  Yes Varney Biles, MD  metroNIDAZOLE (FLAGYL) 500 MG tablet Take 1 tablet (500 mg total) by mouth 2 (two) times daily. 02/08/22  Yes Varney Biles, MD      Allergies    Patient has no allergy information on record.    Review of Systems   Review of Systems  All other systems reviewed and are negative.   Physical Exam Updated Vital Signs BP (!) 138/90 (BP Location: Left Arm)   Pulse 75   Temp 97.8 F (36.6 C) (Oral)   Resp 18   SpO2 100%  Physical Exam Vitals and nursing note reviewed.  Constitutional:      Appearance: She is well-developed.  HENT:     Head:  Atraumatic.  Eyes:     Extraocular Movements: Extraocular movements intact.     Pupils: Pupils are equal, round, and reactive to light.  Cardiovascular:     Rate and Rhythm: Normal rate.  Pulmonary:     Effort: Pulmonary effort is normal.  Abdominal:     Tenderness: There is abdominal tenderness.     Comments: Left lower quadrant tenderness without any rebound or guarding  Musculoskeletal:        General: No swelling or tenderness.     Cervical back: Normal range of motion and neck supple.  Skin:    General: Skin is warm and dry.  Neurological:     Mental Status: She is alert and oriented to person, place, and time.     ED Results / Procedures / Treatments   Labs (all labs ordered are listed, but only abnormal results are displayed) Labs Reviewed  CBC WITH DIFFERENTIAL/PLATELET - Abnormal; Notable for the following components:      Result Value   HCT 35.9 (*)    All other components within normal limits  BASIC METABOLIC PANEL - Abnormal; Notable for the following components:   Calcium 8.5 (*)    All other components within normal limits  TROPONIN I (HIGH SENSITIVITY)  TROPONIN I (HIGH SENSITIVITY)    EKG EKG Interpretation  Date/Time:  Monday February 08 2022 12:12:47 EST Ventricular Rate:  60  PR Interval:  52 QRS Duration: 94 QT Interval:  446 QTC Calculation: 446 R Axis:   66 Text Interpretation: Sinus rhythm Short PR interval Borderline ST elevation, inferior leads early repo, j point elevation No old tracing to compare Confirmed by Derwood Kaplan [phone]) on 02/08/2022 1:15:13 PM  Radiology No results found.  Procedures Procedures    Medications Ordered in ED Medications  amoxicillin-clavulanate (AUGMENTIN) 875-125 MG per tablet 1 tablet (1 tablet Oral Given 02/08/22 1302)    ED Course/ Medical Decision Making/ A&P                           Medical Decision Making Amount and/or Complexity of Data Reviewed Labs: ordered.  Risk Prescription drug  management.   This patient presents to the ED with chief complaint(s) of left lower quadrant abdominal pain with pertinent past medical history of diverticulosis.The complaint involves an extensive differential diagnosis and also carries with it a high risk of complications and morbidity.    The differential diagnosis includes diverticulitis, enteritis, electrolyte normality. She has some nonspecific EKG changes, J-point elevation, therefore she was brought to the emergency room.  She denies any chest pain or shortness of breath today.  She had near syncope-like episode at the site because of her pain.  BP was initially low.  No concerns for posterior stroke.  We will get repeat EKG here and high-sensitivity troponin.  Additional history obtained: Additional history obtained from EMS   Independent labs interpretation:  The following labs were independently interpreted: Patient's work-up is reassuring.  CBC, metabolic profile is normal.  Treatment and Reassessment: Patient reassessed.  Results discussed with her.  Advised that we do not need CT scan given that she does not have any complicated finding on exam.  Patient is actually comfortable with it.  She is requesting that be given antibiotics that are cheap.  She is again confirming that the symptoms are similar to her diverticulitis and she has not had any chest pain or shortness of breath today or in the recent past with exertion.   Final Clinical Impression(s) / ED Diagnoses Final diagnoses:  Acute diverticulitis    Rx / DC Orders ED Discharge Orders          Ordered    amoxicillin-clavulanate (AUGMENTIN) 875-125 MG tablet  Every 12 hours,   Status:  Discontinued        02/08/22 1433    ciprofloxacin (CIPRO) 500 MG tablet  Every 12 hours        02/08/22 1455    metroNIDAZOLE (FLAGYL) 500 MG tablet  2 times daily        02/08/22 1455              Derwood Kaplan, MD 02/08/22 1517

## 2022-02-08 NOTE — Discharge Instructions (Signed)
You were seen in the emergency room for severe abdominal pain. Clinically, this appears to be diverticulitis.  Your cardiac work-up including heart enzyme is normal.  We have prescribed you antibiotics that can be picked up from [facility], at a cost of $14.  Please take Augmentin only for 7 days.  Please return to the ER if your symptoms worsen; you have increased pain, fevers, chills, inability to keep any medications down, confusion. Otherwise see the outpatient doctor as requested.
